# Patient Record
Sex: Female | Born: 1960 | Race: White | Hispanic: No | Marital: Married | Smoking: Current every day smoker
Health system: Southern US, Community
[De-identification: ages and names within clinical notes are randomized; demographics above are authoritative.]

## PROBLEM LIST (undated history)

## (undated) DIAGNOSIS — E079 Disorder of thyroid, unspecified: Secondary | ICD-10-CM

## (undated) DIAGNOSIS — F17200 Nicotine dependence, unspecified, uncomplicated: Secondary | ICD-10-CM

## (undated) DIAGNOSIS — D219 Benign neoplasm of connective and other soft tissue, unspecified: Secondary | ICD-10-CM

## (undated) DIAGNOSIS — D48118 Desmoid tumor of other site: Secondary | ICD-10-CM

## (undated) DIAGNOSIS — T7840XA Allergy, unspecified, initial encounter: Secondary | ICD-10-CM

## (undated) DIAGNOSIS — D481 Neoplasm of uncertain behavior of connective and other soft tissue: Secondary | ICD-10-CM

## (undated) HISTORY — PX: ROTATOR CUFF REPAIR: SHX139

## (undated) HISTORY — DX: Nicotine dependence, unspecified, uncomplicated: F17.200

## (undated) HISTORY — DX: Benign neoplasm of connective and other soft tissue, unspecified: D21.9

## (undated) HISTORY — PX: NASAL SINUS SURGERY: SHX719

## (undated) HISTORY — PX: THYROIDECTOMY: SHX17

## (undated) HISTORY — PX: APPENDECTOMY: SHX54

## (undated) HISTORY — DX: Allergy, unspecified, initial encounter: T78.40XA

## (undated) HISTORY — PX: ENDOMETRIAL ABLATION: SHX621

## (undated) HISTORY — DX: Desmoid tumor of other site: D48.118

## (undated) HISTORY — PX: CHOLECYSTECTOMY: SHX55

## (undated) HISTORY — PX: LAPAROTOMY: SHX154

## (undated) HISTORY — DX: Neoplasm of uncertain behavior of connective and other soft tissue: D48.1

## (undated) HISTORY — DX: Disorder of thyroid, unspecified: E07.9

---

## 2014-11-10 ENCOUNTER — Ambulatory Visit (INDEPENDENT_AMBULATORY_CARE_PROVIDER_SITE_OTHER): Payer: BC Managed Care – PPO | Admitting: Obstetrics & Gynecology

## 2014-11-10 ENCOUNTER — Encounter: Payer: Self-pay | Admitting: Obstetrics & Gynecology

## 2014-11-10 VITALS — BP 128/70 | HR 80 | Resp 16 | Ht 72.0 in

## 2014-11-10 DIAGNOSIS — Z124 Encounter for screening for malignant neoplasm of cervix: Secondary | ICD-10-CM | POA: Diagnosis not present

## 2014-11-10 DIAGNOSIS — E039 Hypothyroidism, unspecified: Secondary | ICD-10-CM | POA: Diagnosis not present

## 2014-11-10 DIAGNOSIS — Z01419 Encounter for gynecological examination (general) (routine) without abnormal findings: Secondary | ICD-10-CM

## 2014-11-10 DIAGNOSIS — Z1151 Encounter for screening for human papillomavirus (HPV): Secondary | ICD-10-CM | POA: Diagnosis not present

## 2014-11-10 NOTE — Progress Notes (Signed)
Subjective:    Carolyn Brock is a 53 y.o. 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 The patient has no complaints today. She had a Bartholin's cyst seen at [facility] 2 weeks ago. It was drained.  The patient is sexually active. GYN screening history: last pap: was normal. The patient wears seatbelts: yes. The patient participates in regular exercise: yes. Has the patient ever been transfused or tattooed?: no. The patient reports that there is not domestic violence in her life.   Menstrual History: OB History    Gravida Para Term Preterm AB TAB SAB Ectopic Multiple Living   1 1 1       1       Menarche age: 64  No LMP recorded. Patient has had an ablation.    The following portions of the patient's history were reviewed and updated as appropriate: allergies, current medications, past family history, past medical history, past social history, past surgical history and problem list.  Review of Systems A comprehensive review of systems was negative. She declines a flu vaccine today. She had a mammogram 9/15. Does SBEs. S/P colonoscopy. Denies dyspareunia. Married for 17 years.   Objective:    BP 128/70 mmHg  Pulse 80  Resp 16  Ht 6' (1.829 m)  General Appearance:    Alert, cooperative, no distress, appears stated age  Head:    Normocephalic, without obvious abnormality, atraumatic  Eyes:    PERRL, conjunctiva/corneas clear, EOM's intact, fundi    benign, both eyes  Ears:    Normal TM's and external ear canals, both ears  Nose:   Nares normal, septum midline, mucosa normal, no drainage    or sinus tenderness  Throat:   Lips, mucosa, and tongue normal; teeth and gums normal  Neck:   Supple, symmetrical, trachea midline, no adenopathy;    thyroid:  no enlargement/tenderness/nodules; no carotid   bruit or JVD  Back:     Symmetric, no curvature, ROM normal, no CVA tenderness  Lungs:     Clear to auscultation bilaterally, respirations unlabored  Chest  Wall:    No tenderness or deformity   Heart:    Regular rate and rhythm, S1 and S2 normal, no murmur, rub   or gallop  Breast Exam:    No tenderness, masses, or nipple abnormality  Abdomen:     Soft, non-tender, bowel sounds active all four quadrants,    no masses, no organomegaly  Genitalia:    Normal female without lesion, discharge or tenderness     Extremities:   Extremities normal, atraumatic, no cyanosis or edema  Pulses:   2+ and symmetric all extremities  Skin:   Skin color, texture, turgor normal, no rashes or lesions  Lymph nodes:   Cervical, supraclavicular, and axillary nodes normal  Neurologic:   CNII-XII intact, normal strength, sensation and reflexes    throughout  .    Assessment:    Healthy female exam.    Plan:     Breast self exam technique reviewed and patient encouraged to perform self-exam monthly. Thin prep Pap smear. with cotesting

## 2014-11-11 LAB — TSH: TSH: 0.368 u[IU]/mL (ref 0.350–4.500)

## 2014-11-15 LAB — CYTOLOGY - PAP

## 2015-10-23 ENCOUNTER — Telehealth: Payer: Self-pay | Admitting: *Deleted

## 2015-10-23 DIAGNOSIS — Z1231 Encounter for screening mammogram for malignant neoplasm of breast: Secondary | ICD-10-CM

## 2015-10-23 NOTE — Telephone Encounter (Signed)
Entered MM order for pt.

## 2015-11-01 ENCOUNTER — Ambulatory Visit (INDEPENDENT_AMBULATORY_CARE_PROVIDER_SITE_OTHER): Payer: BC Managed Care – PPO

## 2015-11-01 DIAGNOSIS — Z1231 Encounter for screening mammogram for malignant neoplasm of breast: Secondary | ICD-10-CM | POA: Diagnosis not present

## 2017-03-11 ENCOUNTER — Other Ambulatory Visit (HOSPITAL_COMMUNITY): Payer: Self-pay | Admitting: Obstetrics & Gynecology

## 2017-03-11 DIAGNOSIS — Z1231 Encounter for screening mammogram for malignant neoplasm of breast: Secondary | ICD-10-CM

## 2017-04-01 ENCOUNTER — Ambulatory Visit: Payer: BC Managed Care – PPO

## 2017-04-04 ENCOUNTER — Ambulatory Visit: Payer: BC Managed Care – PPO

## 2017-04-30 ENCOUNTER — Ambulatory Visit (INDEPENDENT_AMBULATORY_CARE_PROVIDER_SITE_OTHER): Payer: BC Managed Care – PPO

## 2017-04-30 DIAGNOSIS — Z1231 Encounter for screening mammogram for malignant neoplasm of breast: Secondary | ICD-10-CM | POA: Diagnosis not present

## 2018-04-20 ENCOUNTER — Other Ambulatory Visit (HOSPITAL_COMMUNITY): Payer: Self-pay | Admitting: Obstetrics & Gynecology

## 2018-04-20 DIAGNOSIS — Z1231 Encounter for screening mammogram for malignant neoplasm of breast: Secondary | ICD-10-CM

## 2018-05-05 DIAGNOSIS — Z8371 Family history of colonic polyps: Secondary | ICD-10-CM | POA: Insufficient documentation

## 2018-05-06 ENCOUNTER — Ambulatory Visit (INDEPENDENT_AMBULATORY_CARE_PROVIDER_SITE_OTHER): Payer: BC Managed Care – PPO

## 2018-05-06 DIAGNOSIS — Z1231 Encounter for screening mammogram for malignant neoplasm of breast: Secondary | ICD-10-CM | POA: Diagnosis not present

## 2018-09-28 ENCOUNTER — Ambulatory Visit (INDEPENDENT_AMBULATORY_CARE_PROVIDER_SITE_OTHER): Payer: BC Managed Care – PPO | Admitting: Physician Assistant

## 2018-09-28 ENCOUNTER — Ambulatory Visit (INDEPENDENT_AMBULATORY_CARE_PROVIDER_SITE_OTHER): Payer: BC Managed Care – PPO

## 2018-09-28 ENCOUNTER — Encounter: Payer: Self-pay | Admitting: Physician Assistant

## 2018-09-28 VITALS — BP 114/74 | HR 78 | Ht 71.5 in | Wt 181.0 lb

## 2018-09-28 DIAGNOSIS — E890 Postprocedural hypothyroidism: Secondary | ICD-10-CM

## 2018-09-28 DIAGNOSIS — J309 Allergic rhinitis, unspecified: Secondary | ICD-10-CM

## 2018-09-28 DIAGNOSIS — Z130 Encounter for screening for diseases of the blood and blood-forming organs and certain disorders involving the immune mechanism: Secondary | ICD-10-CM

## 2018-09-28 DIAGNOSIS — Z7689 Persons encountering health services in other specified circumstances: Secondary | ICD-10-CM

## 2018-09-28 DIAGNOSIS — J329 Chronic sinusitis, unspecified: Secondary | ICD-10-CM

## 2018-09-28 DIAGNOSIS — Z13220 Encounter for screening for lipoid disorders: Secondary | ICD-10-CM

## 2018-09-28 DIAGNOSIS — G43009 Migraine without aura, not intractable, without status migrainosus: Secondary | ICD-10-CM

## 2018-09-28 DIAGNOSIS — F17200 Nicotine dependence, unspecified, uncomplicated: Secondary | ICD-10-CM | POA: Insufficient documentation

## 2018-09-28 DIAGNOSIS — J3489 Other specified disorders of nose and nasal sinuses: Secondary | ICD-10-CM

## 2018-09-28 DIAGNOSIS — Z131 Encounter for screening for diabetes mellitus: Secondary | ICD-10-CM

## 2018-09-28 MED ORDER — FLUTICASONE PROPIONATE 50 MCG/ACT NA SUSP: 1.0000 | Freq: Every day | NASAL | 3 refills | Status: AC

## 2018-09-28 MED ORDER — ELETRIPTAN HYDROBROMIDE 40 MG PO TABS: ORAL_TABLET | ORAL | 3 refills | Status: DC

## 2018-09-28 MED ORDER — LEVOCETIRIZINE DIHYDROCHLORIDE 5 MG PO TABS: 5.0000 mg | ORAL_TABLET | Freq: Every evening | ORAL | 3 refills | Status: AC

## 2018-09-28 NOTE — Progress Notes (Signed)
HPI:                                                                Carolyn Brock is a 57 y.o. female who presents to [facility] today to establish care  Current concerns: sinuses/headaches  Migraines: states her sinuses are the biggest trigger for her headaches. Typical migraine she will have severe pain behind her left eye. Associated with nausea, photophobia. These occur once every several months. They are aborted with Relpax.   Recurrent sinusitis: reports she is allergic to mold. She had immunotherapy in 2005. She has a history of sinus surgery in the mid 1990's, states this was not helpful.   Hypothyroidism: takes synthroid daily without issues. Denies chest pain, heart palpitations, tremor, GI symptoms, or skin changes.   No flowsheet data found.  No flowsheet data found.    Past Medical History:  Diagnosis Date  . Desmoid tumor of abdomen   . Fibroid   . Thyroid disease   . Tobacco use disorder    Past Surgical History:  Procedure Laterality Date  . APPENDECTOMY    . CESAREAN SECTION    . CHOLECYSTECTOMY    . ENDOMETRIAL ABLATION    . LAPAROTOMY    . NASAL SINUS SURGERY    . ROTATOR CUFF REPAIR    . THYROIDECTOMY     Social History   Tobacco Use  . Smoking status: Current Every Day Smoker    Packs/day: 0.50    Years: 35.00    Pack years: 17.50    Types: Cigarettes  . Smokeless tobacco: Never Used  Substance Use Topics  . Alcohol use: Not Currently    Alcohol/week: 0.0 standard drinks    Comment: rarely   family history includes Breast cancer in her mother and paternal grandmother; Cancer in her mother and paternal grandmother; Depression in her father; Diabetes in her mother and paternal grandmother.    ROS: Review of Systems  HENT: Positive for sinus pain.   Neurological: Positive for headaches.  Endo/Heme/Allergies: Positive for environmental allergies.  All other systems reviewed and are  negative.    Medications: Current Outpatient Medications  Medication Sig Dispense Refill  . eletriptan (RELPAX) 40 MG tablet TAKE ONE TABLET AT ONSET OF HEADACHE, MAY REPEAT IN TWO HOURS. 10 tablet 3  . promethazine (PHENERGAN) 25 MG tablet Take 25 mg by mouth every 6 (six) hours as needed.    . fluticasone (FLONASE) 50 MCG/ACT nasal spray Place 1 spray into both nostrils daily. 16 g 3  . levocetirizine (XYZAL ALLERGY 24HR) 5 MG tablet Take 1 tablet (5 mg total) by mouth every evening. 90 tablet 3  . levothyroxine (SYNTHROID, LEVOTHROID) 125 MCG tablet Take 1 tablet (125 mcg total) by mouth daily before breakfast. 90 tablet 1  . VITAMIN D, CHOLECALCIFEROL, PO Take by mouth.     No current facility-administered medications for this visit.    Allergies  Allergen Reactions  . Codeine Nausea And Vomiting  . Imitrex [Sumatriptan] Other (See Comments)    palpitations  . Penicillins Rash       Objective:  BP 114/74   Pulse 78   Ht 5' 11.5" (1.816 m)   Wt 181 lb (82.1 kg)   BMI  24.89 kg/m  Gen:  alert, not ill-appearing, no distress, appropriate for age 32: head normocephalic without obvious abnormality, conjunctiva and cornea clear, TM's pearly gray and semitransparent, nasal mucosa pink, no frontal or maxillary sinus tenderness, oropharynx clear, neck supple, no cervical adenopathy, trachea midline Pulm: Normal work of breathing, normal phonation, clear to auscultation bilaterally, no wheezes, rales or rhonchi CV: Normal rate, regular rhythm, s1 and s2 distinct, no murmurs, clicks or rubs  Neuro: alert and oriented x 3, no tremor MSK: extremities atraumatic, normal gait and station Skin: intact, no rashes on exposed skin, no jaundice, no cyanosis Psych: well-groomed, cooperative, good eye contact, euthymic mood, affect mood-congruent, speech is articulate, and thought processes clear and goal-directed    No results found for this or any previous visit (from the past 72  hour(s)). No results found.    Assessment and Plan: 57 y.o. female with   .[name] was seen today for establish care.  Diagnoses and all orders for this visit:  Encounter to establish care  Postsurgical hypothyroidism -     TSH  Migraine without aura and without status migrainosus, not intractable -     eletriptan (RELPAX) 40 MG tablet; TAKE ONE TABLET AT ONSET OF HEADACHE, MAY REPEAT IN TWO HOURS.  Allergic rhinitis, unspecified seasonality, unspecified trigger -     fluticasone (FLONASE) 50 MCG/ACT nasal spray; Place 1 spray into both nostrils daily. -     levocetirizine (XYZAL ALLERGY 24HR) 5 MG tablet; Take 1 tablet (5 mg total) by mouth every evening.  Tobacco use disorder  Recurrent sinus infections -     Cancel: CT Maxillofacial WO CM -     CT MAXILLOFACIAL LTD WO CM  Screening for lipid disorders -     Lipid Panel w/reflex Direct LDL  Screening for diabetes mellitus -     Comprehensive metabolic panel  Screening for blood disease -     CBC  Sinus pressure -     Cancel: CT Maxillofacial WO CM -     CT MAXILLOFACIAL LTD WO CM   - Personally reviewed PMH, PSH, PFH, medications, allergies, HM - Age-appropriate cancer screening: Pap smear UTD, mammogram UTD, not yet a candidate for lung cancer screening - Influenza declined - Tdap declined  Sinus pressure/history of sinusitis - oral antihistamine and intranasal steroid daily - CT sinus pending - we briefly discussed smoking cessation  - we also discussed option to repeat immunotherapy. Declines referral to Allergy/Immunology today  Patient education and anticipatory guidance given Patient agrees with treatment plan Follow-up in 1 month or sooner as needed if symptoms worsen or fail to improve  Darlyne Russian PA-C

## 2018-09-28 NOTE — Patient Instructions (Addendum)
Continue Montelukast/Singulair at bedtime Start Xyzal (antihistamine). Can take this with your Singulair Start generic Flonase (intranasal steroid) daily Avoid Afrin/Oxymetazolone, which causes rebound nasal congestion when used for more than 3 days   Allergic Rhinitis, Adult Allergic rhinitis is an allergic reaction that affects the mucous membrane inside the nose. It causes sneezing, a runny or stuffy nose, and the feeling of mucus going down the back of the throat (postnasal drip). Allergic rhinitis can be mild to severe. There are two types of allergic rhinitis:  Seasonal. This type is also called hay fever. It happens only during certain seasons.  Perennial. This type can happen at any time of the year.  What are the causes? This condition happens when the body's defense system (immune system) responds to certain harmless substances called allergens as though they were germs.  Seasonal allergic rhinitis is triggered by pollen, which can come from grasses, trees, and weeds. Perennial allergic rhinitis may be caused by:  House dust mites.  Pet dander.  Mold spores.  What are the signs or symptoms? Symptoms of this condition include:  Sneezing.  Runny or stuffy nose (nasal congestion).  Postnasal drip.  Itchy nose.  Tearing of the eyes.  Trouble sleeping.  Daytime sleepiness.  How is this diagnosed? This condition may be diagnosed based on:  Your medical history.  A physical exam.  Tests to check for related conditions, such as: ? Asthma. ? Pink eye. ? Ear infection. ? Upper respiratory infection.  Tests to find out which allergens trigger your symptoms. These may include skin or blood tests.  How is this treated? There is no cure for this condition, but treatment can help control symptoms. Treatment may include:  Taking medicines that block allergy symptoms, such as antihistamines. Medicine may be given as a shot, nasal spray, or pill.  Avoiding the  allergen.  Desensitization. This treatment involves getting ongoing shots until your body becomes less sensitive to the allergen. This treatment may be done if other treatments do not help.  If taking medicine and avoiding the allergen does not work, new, stronger medicines may be prescribed.  Follow these instructions at home:  Find out what you are allergic to. Common allergens include smoke, dust, and pollen.  Avoid the things you are allergic to. These are some things you can do to help avoid allergens: ? Replace carpet with wood, tile, or vinyl flooring. Carpet can trap dander and dust. ? Do not smoke. Do not allow smoking in your home. ? Change your heating and air conditioning filter at least once a month. ? During allergy season:  Keep windows closed as much as possible.  Plan outdoor activities when pollen counts are lowest. This is usually during the evening hours.  When coming indoors, change clothing and shower before sitting on furniture or bedding.  Take over-the-counter and prescription medicines only as told by your health care provider.  Keep all follow-up visits as told by your health care provider. This is important. Contact a health care provider if:  You have a fever.  You develop a persistent cough.  You make whistling sounds when you breathe (you wheeze).  Your symptoms interfere with your normal daily activities. Get help right away if:  You have shortness of breath. Summary  This condition can be managed by taking medicines as directed and avoiding allergens.  Contact your health care provider if you develop a persistent cough or fever.  During allergy season, keep windows closed as much as possible.  This information is not intended to replace advice given to you by your health care provider. Make sure you discuss any questions you have with your health care provider. Document Released: 09/10/2001 Document Revised: 01/23/2017 Document Reviewed:  01/23/2017 Elsevier Interactive Patient Education  [employer].

## 2018-09-29 LAB — COMPREHENSIVE METABOLIC PANEL
AG Ratio: 2.1 (calc) (ref 1.0–2.5)
ALT: 16 U/L (ref 6–29)
AST: 25 U/L (ref 10–35)
Albumin: 4.5 g/dL (ref 3.6–5.1)
Alkaline phosphatase (APISO): 89 U/L (ref 33–130)
BUN: 17 mg/dL (ref 7–25)
CO2: 25 mmol/L (ref 20–32)
CREATININE: 0.94 mg/dL (ref 0.50–1.05)
Calcium: 9.4 mg/dL (ref 8.6–10.4)
Chloride: 103 mmol/L (ref 98–110)
GLUCOSE: 100 mg/dL — AB (ref 65–99)
Globulin: 2.1 g/dL (calc) (ref 1.9–3.7)
Potassium: 4.8 mmol/L (ref 3.5–5.3)
Sodium: 138 mmol/L (ref 135–146)
TOTAL PROTEIN: 6.6 g/dL (ref 6.1–8.1)
Total Bilirubin: 0.9 mg/dL (ref 0.2–1.2)

## 2018-09-29 LAB — LIPID PANEL W/REFLEX DIRECT LDL
CHOL/HDL RATIO: 2.6 (calc) (ref <5.0)
Cholesterol: 176 mg/dL (ref <200)
HDL: 67 mg/dL (ref >50)
LDL Cholesterol (Calc): 93 mg/dL (calc)
NON-HDL CHOLESTEROL (CALC): 109 mg/dL (ref <130)
TRIGLYCERIDES: 69 mg/dL (ref <150)

## 2018-09-29 LAB — CBC
HCT: 44.1 % (ref 35.0–45.0)
Hemoglobin: 14.8 g/dL (ref 11.7–15.5)
MCH: 31 pg (ref 27.0–33.0)
MCHC: 33.6 g/dL (ref 32.0–36.0)
MCV: 92.3 fL (ref 80.0–100.0)
MPV: 10.4 fL (ref 7.5–12.5)
PLATELETS: 275 10*3/uL (ref 140–400)
RBC: 4.78 10*6/uL (ref 3.80–5.10)
RDW: 12.5 % (ref 11.0–15.0)
WBC: 5.7 10*3/uL (ref 3.8–10.8)

## 2018-09-29 LAB — TSH: TSH: 0.08 mIU/L — ABNORMAL LOW

## 2018-09-30 ENCOUNTER — Other Ambulatory Visit: Payer: Self-pay | Admitting: Physician Assistant

## 2018-09-30 DIAGNOSIS — E890 Postprocedural hypothyroidism: Secondary | ICD-10-CM

## 2018-09-30 MED ORDER — LEVOTHYROXINE SODIUM 125 MCG PO TABS: 125.0000 ug | ORAL_TABLET | Freq: Every day | ORAL | 1 refills | Status: DC

## 2018-10-08 ENCOUNTER — Encounter: Payer: Self-pay | Admitting: Physician Assistant

## 2018-10-08 DIAGNOSIS — J329 Chronic sinusitis, unspecified: Secondary | ICD-10-CM | POA: Insufficient documentation

## 2018-10-08 DIAGNOSIS — J3489 Other specified disorders of nose and nasal sinuses: Secondary | ICD-10-CM | POA: Insufficient documentation

## 2018-10-26 ENCOUNTER — Ambulatory Visit: Payer: BC Managed Care – PPO | Admitting: Physician Assistant

## 2019-03-11 ENCOUNTER — Other Ambulatory Visit: Payer: Self-pay | Admitting: Physician Assistant

## 2019-03-11 DIAGNOSIS — E890 Postprocedural hypothyroidism: Secondary | ICD-10-CM

## 2019-06-07 ENCOUNTER — Other Ambulatory Visit: Payer: Self-pay | Admitting: Physician Assistant

## 2019-06-07 DIAGNOSIS — E890 Postprocedural hypothyroidism: Secondary | ICD-10-CM

## 2019-09-10 ENCOUNTER — Other Ambulatory Visit: Payer: Self-pay

## 2019-09-10 ENCOUNTER — Ambulatory Visit (INDEPENDENT_AMBULATORY_CARE_PROVIDER_SITE_OTHER): Payer: BC Managed Care – PPO

## 2019-09-10 ENCOUNTER — Ambulatory Visit (INDEPENDENT_AMBULATORY_CARE_PROVIDER_SITE_OTHER): Payer: BC Managed Care – PPO | Admitting: Physician Assistant

## 2019-09-10 ENCOUNTER — Other Ambulatory Visit: Payer: Self-pay | Admitting: Obstetrics & Gynecology

## 2019-09-10 ENCOUNTER — Encounter: Payer: Self-pay | Admitting: Physician Assistant

## 2019-09-10 VITALS — BP 122/77 | HR 79 | Temp 98.6°F | Wt 183.0 lb

## 2019-09-10 DIAGNOSIS — M25512 Pain in left shoulder: Secondary | ICD-10-CM | POA: Insufficient documentation

## 2019-09-10 DIAGNOSIS — Z5181 Encounter for therapeutic drug level monitoring: Secondary | ICD-10-CM

## 2019-09-10 DIAGNOSIS — E890 Postprocedural hypothyroidism: Secondary | ICD-10-CM | POA: Diagnosis not present

## 2019-09-10 DIAGNOSIS — Z1231 Encounter for screening mammogram for malignant neoplasm of breast: Secondary | ICD-10-CM

## 2019-09-10 MED ORDER — LEVOTHYROXINE SODIUM 125 MCG PO TABS: 125.0000 ug | ORAL_TABLET | Freq: Every day | ORAL | 0 refills | Status: DC

## 2019-09-10 NOTE — Progress Notes (Signed)
Subjective:    I'm seeing this patient as a consultation for: Nelson Chimes, PA-C  CC: Left shoulder pain  HPI: For several weeks this pleasant 58 year old female has had severe pain that she localizes over the deltoid of her left shoulder, worse with overhead activities, no trauma, she is post rotator cuff repair arthroscopically and acromioplasty.  Pain is severe, persistent, localized without radiation.  I reviewed the past medical history, family history, social history, surgical history, and allergies today and no changes were needed.  Please see the problem list section below in epic for further details.  Past Medical History: Past Medical History:  Diagnosis Date  . Allergy   . Desmoid tumor of abdomen   . Fibroid   . Thyroid disease   . Tobacco use disorder    Past Surgical History: Past Surgical History:  Procedure Laterality Date  . APPENDECTOMY    . CESAREAN SECTION    . CHOLECYSTECTOMY    . ENDOMETRIAL ABLATION    . LAPAROTOMY    . NASAL SINUS SURGERY    . ROTATOR CUFF REPAIR    . THYROIDECTOMY     Social History: Social History   Socioeconomic History  . Marital status: Married    Spouse name: Not on file  . Number of children: Not on file  . Years of education: Not on file  . Highest education level: Not on file  Occupational History  . Occupation: principal  Social Needs  . Financial resource strain: Not on file  . Food insecurity    Worry: Not on file    Inability: Not on file  . Transportation needs    Medical: Not on file    Non-medical: Not on file  Tobacco Use  . Smoking status: Current Every Day Smoker    Packs/day: 0.50    Years: 35.00    Pack years: 17.50    Types: Cigarettes  . Smokeless tobacco: Never Used  Substance and Sexual Activity  . Alcohol use: Not Currently    Alcohol/week: 0.0 standard drinks    Comment: rarely  . Drug use: Never  . Sexual activity: Yes    Partners: Male    Birth control/protection: None   Lifestyle  . Physical activity    Days per week: Not on file    Minutes per session: Not on file  . Stress: Not on file  Relationships  . Social Herbalist on phone: Not on file    Gets together: Not on file    Attends religious service: Not on file    Active member of club or organization: Not on file    Attends meetings of clubs or organizations: Not on file    Relationship status: Not on file  Other Topics Concern  . Not on file  Social History Narrative  . Not on file   Family History: Family History  Problem Relation Age of Onset  . Diabetes Mother   . Cancer Mother        breast  . Breast cancer Mother   . Depression Father   . Diabetes Paternal Grandmother   . Cancer Paternal Grandmother        breast  . Breast cancer Paternal Grandmother    Allergies: Allergies  Allergen Reactions  . Codeine Nausea And Vomiting  . Imitrex [Sumatriptan] Other (See Comments)    palpitations  . Penicillins Rash   Medications: See med rec.  Review of Systems: No headache, visual changes, nausea,  vomiting, diarrhea, constipation, dizziness, abdominal pain, skin rash, fevers, chills, night sweats, weight loss, swollen lymph nodes, body aches, joint swelling, muscle aches, chest pain, shortness of breath, mood changes, visual or auditory hallucinations.   Objective:   General: Well Developed, well nourished, and in no acute distress.  Neuro:  Extra-ocular muscles intact, able to move all 4 extremities, sensation grossly intact.  Deep tendon reflexes tested were normal. Psych: Alert and oriented, mood congruent with affect. ENT:  Ears and nose appear unremarkable.  Hearing grossly normal. Neck: Unremarkable overall appearance, trachea midline.  No visible thyroid enlargement. Eyes: Conjunctivae and lids appear unremarkable.  Pupils equal and round. Skin: Warm and dry, no rashes noted.  Cardiovascular: Pulses palpable, no extremity edema. Left shoulder: Inspection  reveals no abnormalities, atrophy or asymmetry. Palpation is normal with no tenderness over AC joint or bicipital groove. ROM is full in all planes. Rotator cuff strength normal throughout. Positive Neer and Hawkin's tests, empty can. Speeds and Yergason's tests normal. No labral pathology noted with negative Obrien's, negative crank, negative clunk, and good stability. Normal scapular function observed. No painful arc and no drop arm sign. No apprehension sign  Procedure: Real-time Ultrasound Guided injection of the left subacromial bursa Device: GE Logiq E  Verbal informed consent obtained.  Time-out conducted.  Noted no overlying erythema, induration, or other signs of local infection.  Skin prepped in a sterile fashion.  Local anesthesia: Topical Ethyl chloride.  With sterile technique and under real time ultrasound guidance:  1 cc Kenalog 40, 2 cc lidocaine, 2 cc bupivacaine injected easily Completed without difficulty  Pain immediately resolved suggesting accurate placement of the medication.  Advised to call if fevers/chills, erythema, induration, drainage, or persistent bleeding.  Images permanently stored and available for review in the ultrasound unit.  Impression: Technically successful ultrasound guided injection.  Impression and Recommendations:   This case required medical decision making of moderate complexity.  Acute pain of left shoulder Symptoms and signs of impingement related. She is post rotator cuff repair. Subacromial injection today, x-rays, formal physical therapy. Return to see me in 6 weeks.   ___________________________________________ Gwen Her. Dianah Field, M.D., ABFM., CAQSM. Primary Care and Sports Medicine [facility]  Adjunct Professor of [employer]

## 2019-09-10 NOTE — Progress Notes (Signed)
HPI:                                                                Carolyn Brock is a 58 y.o. female who presents to [facility]: [place] today for left shoulder pain  Onset 2 months ago Reports pain in the anterior shoulder joint Pain is persistent and worse with any type of movement, but worse with abduction History of rotator cuff repair on the same shoulder (fall 2015) She denies known injury or trauma or repetitive stress She is right-handed Taking 4-6 Advil per day, Tylenol and using Biofreeze without relief   Postsurgical Hypothyroidism: unclear history. Reports she had some radioactive treatments and nodules. Takes synthroid daily without issues. Dose was lowered to 125 mcg approx 11 months ago. Denies chest pain, heart palpitations, tremor, GI symptoms, or skin changes.   Past Medical History:  Diagnosis Date  . Allergy   . Desmoid tumor of abdomen   . Fibroid   . Thyroid disease   . Tobacco use disorder    Past Surgical History:  Procedure Laterality Date  . APPENDECTOMY    . CESAREAN SECTION    . CHOLECYSTECTOMY    . ENDOMETRIAL ABLATION    . LAPAROTOMY    . NASAL SINUS SURGERY    . ROTATOR CUFF REPAIR    . THYROIDECTOMY     Social History   Tobacco Use  . Smoking status: Current Every Day Smoker    Packs/day: 0.50    Years: 35.00    Pack years: 17.50    Types: Cigarettes  . Smokeless tobacco: Never Used  Substance Use Topics  . Alcohol use: Not Currently    Alcohol/week: 0.0 standard drinks    Comment: rarely   family history includes Breast cancer in her mother and paternal grandmother; Cancer in her mother and paternal grandmother; Depression in her father; Diabetes in her mother and paternal grandmother.    ROS: negative except as noted in the HPI  Medications: Current Outpatient Medications  Medication Sig Dispense Refill  . eletriptan (RELPAX) 40 MG tablet TAKE ONE TABLET AT ONSET OF HEADACHE, MAY  REPEAT IN TWO HOURS. 10 tablet 3  . fluticasone (FLONASE) 50 MCG/ACT nasal spray Place 1 spray into both nostrils daily. 16 g 3  . levocetirizine (XYZAL ALLERGY 24HR) 5 MG tablet Take 1 tablet (5 mg total) by mouth every evening. 90 tablet 3  . levothyroxine (SYNTHROID) 125 MCG tablet TAKE 1 TABLET BY MOUTH EVERY DAY BEFORE BREAKFAST 90 tablet 0  . VITAMIN D, CHOLECALCIFEROL, PO Take by mouth.    . promethazine (PHENERGAN) 25 MG tablet Take 25 mg by mouth every 6 (six) hours as needed.     No current facility-administered medications for this visit.    Allergies  Allergen Reactions  . Codeine Nausea And Vomiting  . Imitrex [Sumatriptan] Other (See Comments)    palpitations  . Penicillins Rash       Objective:  BP 122/77   Pulse 79   Temp 98.6 F (37 C) (Oral)   Wt 183 lb (83 kg)   BMI 25.17 kg/m   Wt Readings from Last 3 Encounters:  09/10/19 183 lb (83 kg)  09/28/18 181 lb (82.1 kg)   Temp  Readings from Last 3 Encounters:  09/10/19 98.6 F (37 C) (Oral)   BP Readings from Last 3 Encounters:  09/10/19 122/77  09/28/18 114/74  11/10/14 128/70   Pulse Readings from Last 3 Encounters:  09/10/19 79  09/28/18 78  11/10/14 80    Gen:  alert, not ill-appearing, no distress, appropriate for age 83: head normocephalic without obvious abnormality, conjunctiva and cornea clear, trachea midline Pulm: Normal work of breathing, normal phonation Neuro: alert and oriented x 3, no tremor MSK: extremities atraumatic, normal gait and station Left shouder: pain overlying left a/c, no crepitus, positive Hawkin's with guarding, strength intact Skin: intact, no rashes on exposed skin, no jaundice, no cyanosis Psych: well-groomed, cooperative, good eye contact, euthymic mood, affect mood-congruent, speech is articulate, and thought processes clear and goal-directed    No results found for this or any previous visit (from the past 72 hour(s)). No results found.    Assessment  and Plan: 58 y.o. female with   .Carolyn Brock was seen today for shoulder pain.  Diagnoses and all orders for this visit:  Postsurgical hypothyroidism -     TSH + free T4 -     COMPLETE METABOLIC PANEL WITH GFR -     CBC -     levothyroxine (SYNTHROID) 125 MCG tablet; Take 1 tablet (125 mcg total) by mouth daily before breakfast.  Encounter for medication monitoring -     TSH + free T4 -     COMPLETE METABOLIC PANEL WITH GFR -     CBC  Acute pain of left shoulder -     DG Shoulder Left; Future -     Ambulatory referral to Physical Therapy   Consulted sports medicine for L shoulder pain (see note) Referral for formal PT for shoulder impingement    Patient education and anticipatory guidance given Patient agrees with treatment plan Follow-up in 6 months for med mgmt or sooner as needed if symptoms worsen or fail to improve  Darlyne Russian PA-C

## 2019-09-10 NOTE — Assessment & Plan Note (Signed)
Symptoms and signs of impingement related. She is post rotator cuff repair. Subacromial injection today, x-rays, formal physical therapy. Return to see me in 6 weeks.

## 2019-09-11 ENCOUNTER — Encounter: Payer: Self-pay | Admitting: Sports Medicine

## 2019-09-11 LAB — COMPLETE METABOLIC PANEL WITH GFR
AG Ratio: 2.2 (calc) (ref 1.0–2.5)
ALT: 20 U/L (ref 6–29)
AST: 26 U/L (ref 10–35)
Albumin: 4.6 g/dL (ref 3.6–5.1)
Alkaline phosphatase (APISO): 83 U/L (ref 37–153)
BUN: 19 mg/dL (ref 7–25)
CO2: 26 mmol/L (ref 20–32)
Calcium: 9.5 mg/dL (ref 8.6–10.4)
Chloride: 104 mmol/L (ref 98–110)
Creat: 0.97 mg/dL (ref 0.50–1.05)
GFR, Est African American: 76 mL/min/{1.73_m2} (ref >=60)
GFR, Est Non African American: 65 mL/min/{1.73_m2} (ref >=60)
Globulin: 2.1 g/dL (calc) (ref 1.9–3.7)
Glucose, Bld: 102 mg/dL — ABNORMAL HIGH (ref 65–99)
Potassium: 4.9 mmol/L (ref 3.5–5.3)
Sodium: 140 mmol/L (ref 135–146)
Total Bilirubin: 0.6 mg/dL (ref 0.2–1.2)
Total Protein: 6.7 g/dL (ref 6.1–8.1)

## 2019-09-11 LAB — CBC
HCT: 42.8 % (ref 35.0–45.0)
Hemoglobin: 14.3 g/dL (ref 11.7–15.5)
MCH: 31.4 pg (ref 27.0–33.0)
MCHC: 33.4 g/dL (ref 32.0–36.0)
MCV: 94.1 fL (ref 80.0–100.0)
MPV: 10.5 fL (ref 7.5–12.5)
Platelets: 264 10*3/uL (ref 140–400)
RBC: 4.55 10*6/uL (ref 3.80–5.10)
RDW: 12.7 % (ref 11.0–15.0)
WBC: 9.1 10*3/uL (ref 3.8–10.8)

## 2019-09-11 LAB — TSH+FREE T4: TSH W/REFLEX TO FT4: 1.48 mIU/L (ref 0.40–4.50)

## 2019-09-12 ENCOUNTER — Encounter: Payer: Self-pay | Admitting: Sports Medicine

## 2019-09-12 DIAGNOSIS — M25512 Pain in left shoulder: Secondary | ICD-10-CM

## 2019-09-16 ENCOUNTER — Ambulatory Visit (INDEPENDENT_AMBULATORY_CARE_PROVIDER_SITE_OTHER): Payer: BC Managed Care – PPO | Admitting: Physical Therapy

## 2019-09-16 ENCOUNTER — Other Ambulatory Visit: Payer: Self-pay

## 2019-09-16 ENCOUNTER — Encounter: Payer: Self-pay | Admitting: Physical Therapy

## 2019-09-16 DIAGNOSIS — M25512 Pain in left shoulder: Secondary | ICD-10-CM

## 2019-09-16 DIAGNOSIS — R293 Abnormal posture: Secondary | ICD-10-CM

## 2019-09-16 DIAGNOSIS — M6281 Muscle weakness (generalized): Secondary | ICD-10-CM | POA: Diagnosis not present

## 2019-09-16 NOTE — Patient Instructions (Signed)
Access Code: [email or web address]  URL: [email or web address]  Date: 09/16/2019  Prepared by: Faustino Congress   Exercises  Standing Backward Shoulder Rolls - 10 reps - 1 sets - 2x daily - 7x weekly  Seated Scapular Retraction - 10 reps - 1 sets - 5 sec hold - 2x daily - 7x weekly  Shoulder External Rotation and Scapular Retraction - 10 reps - 1 sets - 1-2 sec hold - 2x daily - 7x weekly  Shoulder External Rotation and Scapular Retraction with Resistance - 10 reps - 1 sets - 5 sec hold - 2x daily - 7x weekly  Scapular Retraction with Resistance Advanced - 10 reps - 1 sets - 2x daily - 7x weekly  Squatting Shoulder Row with Anchored Resistance - 10 reps - 1 sets - 2x daily - 7x weekly  Patient Education  Trigger Point Dry Needling  TENS Unit

## 2019-09-16 NOTE — Therapy (Addendum)
[place], [place], [address] Phone: [phone]   Fax:  [phone]  Physical Therapy Evaluation  Patient Details  Name: Carolyn Brock MRN: [id number] Date of Birth: Sep 29, 1961 Referring Provider (PT): Silverio Decamp, MD   Encounter Date: 09/16/2019  PT End of Session - 09/16/19 1017    Visit Number  1    Number of Visits  6    Date for PT Re-Evaluation  10/28/19    PT Start Time  0932    PT Stop Time  1010    PT Time Calculation (min)  38 min    Activity Tolerance  Patient tolerated treatment well    Behavior During Therapy  [facility] for tasks assessed/performed       Past Medical History:  Diagnosis Date  . Allergy   . Desmoid tumor of abdomen   . Fibroid   . Thyroid disease   . Tobacco use disorder     Past Surgical History:  Procedure Laterality Date  . APPENDECTOMY    . CESAREAN SECTION    . CHOLECYSTECTOMY    . ENDOMETRIAL ABLATION    . LAPAROTOMY    . NASAL SINUS SURGERY    . ROTATOR CUFF REPAIR    . THYROIDECTOMY      There were no vitals filed for this visit.   Subjective Assessment - 09/16/19 0934    Subjective  Pt is a 58 y/o female who presents to OPPT for Lt shoulder pain without known injury x 2 months.  Pt reports popping episodes with short-lived sharp pains.  Pt had injection 09/10/19 with improvement in pain, then she reported increased pain with overuse over the weekend.  Pt states pain has now improved since that incident.    Pertinent History  Lt RTC repair 2014    Patient Stated Goals  improve strength, decrease risk of reinjury    Currently in Pain?  No/denies   up to 9/10        [facility] PT Assessment - 09/16/19 0939      Assessment   Medical Diagnosis  M25.512 (ICD-10-CM) - Acute pain of left shoulder    Referring Provider (PT)  Silverio Decamp, MD    Onset Date/Surgical Date  --   2 months   Hand Dominance  Right    Next MD Visit   10/22/19    Prior Therapy  following RTC repair in 2014      Precautions   Precautions  None      Restrictions   Weight Bearing Restrictions  No      Balance Screen   Has the patient fallen in the past 6 months  No    Has the patient had a decrease in activity level because of a fear of falling?   No    Is the patient reluctant to leave their home because of a fear of falling?   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; realtor    Leisure  hiking; concerts - prior to [place]: going to gym      Cognition  Overall Cognitive Status  Within Functional Limits for tasks assessed      Observation/Other Assessments   Focus on Therapeutic Outcomes (FOTO)   58 (42% limited; predicted 30% limited)      Posture/Postural Control   Posture/Postural Control  Postural limitations    Postural Limitations  Rounded Shoulders;Forward head    Posture Comments  poor postural awareness throughout session      ROM / Strength   AROM / PROM / Strength  AROM;Strength      AROM   Overall AROM Comments  Lt shoulder WNL with mild apprehension with initiating movements      Strength   Strength Assessment Site  Shoulder    Right/Left Shoulder  Right;Left    Right Shoulder Flexion  5/5    Right Shoulder ABduction  5/5    Right Shoulder Internal Rotation  5/5    Right Shoulder External Rotation  5/5    Left Shoulder Flexion  3+/5   with pain   Left Shoulder ABduction  3+/5   with pain   Left Shoulder Internal Rotation  5/5    Left Shoulder External Rotation  4/5      Palpation   Palpation comment  pain and tenderness along proximal biceps tendon and supraspinatus tendon insertion; trigger  points noted in upper trap, levator and deltoid on Lt                Objective measurements completed on examination: See above findings.      [facility] Adult PT Treatment/Exercise - 09/16/19 0939      Exercises   Exercises  Shoulder      Shoulder Exercises: Seated   Retraction  Both;5 reps    External Rotation  Both;5 reps    Other Seated Exercises  shoulder rolls backwards x 5 reps      Shoulder Exercises: Standing   External Rotation  Both;5 reps;Theraband    Theraband Level (Shoulder External Rotation)  Level 2 (Red)    Extension  Both;5 reps;Theraband    Theraband Level (Shoulder Extension)  Level 2 (Red)    Row  Both;5 reps;Theraband    Theraband Level (Shoulder Row)  Level 2 (Red)    Other Standing Exercises  cues needed with all exercises for technique and form             PT Education - 09/16/19 1017    Education Details  HEP, DN, TENS    Person(s) Educated  Patient    Methods  Explanation;Demonstration    Comprehension  Verbalized understanding;Returned demonstration;Need further instruction          PT Long Term Goals - 09/16/19 1046      PT LONG TERM GOAL #1   Title  independent with HEP    Status  New    Target Date  10/28/19      PT LONG TERM GOAL #2   Title  FOTO score improved to </= 30% limited for improved function    Status  New    Target Date  10/28/19      PT LONG TERM GOAL #3   Title  verbalized understanding of posture in order to decrease risk of reinjury    Status  New    Target Date  10/28/19      PT LONG TERM GOAL #4   Title  perform Lt shoulder ROM without pain or apprehension for improved function    Status  New    Target Date  10/28/19  PT LONG TERM GOAL #5   Title  demonstrate at least 4/5 Lt shoulder strength for improved function    Status  New    Target Date  10/28/19             Plan - 09/16/19 1018    Clinical Impression Statement  Pt is a 58 y/o female who presents to OPPT for 2 month hx of  Lt shoulder pain without known injury.  Pt does report increased computer work and less exercise due to gym closure and feel this is likely contributing to new onset of symptoms.  Pt demonstrates poor postural awareness, decreased strength and active trigger points affecting functional mobility.  Will benefit from PT to address deficits listed.    Personal Factors and Comorbidities  Comorbidity 1    Comorbidities  hx LT RTC repair    Examination-Activity Limitations  Reach Overhead;Dressing;Carry    Examination-Participation Restrictions  Volunteer   worrk   Stability/Clinical Decision Making  Stable/Uncomplicated    Clinical Decision Making  Low    Rehab Potential  Good    PT Frequency  1x / week    PT Duration  6 weeks    PT Treatment/Interventions  ADLs/Self Care Home Management;Cryotherapy;Electrical Stimulation;Ultrasound;Moist Heat;Iontophoresis 47m/ml Dexamethasone;Therapeutic activities;Therapeutic exercise;Patient/family education;Manual techniques;Passive range of motion;Vasopneumatic Device;Taping;Dry needling    PT Next Visit Plan  review HEP and progress (add doorway stretch), manual/modalities/DN    PT Home Exercise Plan  Access Code: [email or web address]   Consulted and Agree with Plan of Care  Patient       Patient will benefit from skilled therapeutic intervention in order to improve the following deficits and impairments:  Increased fascial restricitons, Increased muscle spasms, Pain, Impaired UE functional use, Postural dysfunction, Decreased strength  Visit Diagnosis: Acute pain of left shoulder - Plan: PT plan of care cert/re-cert  Abnormal posture - Plan: PT plan of care cert/re-cert  Muscle weakness (generalized) - Plan: PT plan of care cert/re-cert     Problem List Patient Active Problem List   Diagnosis Date Noted  . Acute pain of left shoulder 09/10/2019  . Recurrent sinus infections 10/08/2018  . Sinus pressure 10/08/2018  . Postsurgical hypothyroidism 09/28/2018   . Migraine without aura and without status migrainosus, not intractable 09/28/2018  . Allergic rhinitis 09/28/2018  . Tobacco use disorder   . Family history of colonic polyps 05/05/2018      SLaureen Abrahams PT, DPT 09/16/19 10:49 AM     [facility] [address]Phone: [phone]  Fax:  [phone] Name: Carolyn PeyserMRN: [id number] of Birth: [birth year]     PHYSICAL THERAPY DISCHARGE SUMMARY  Visits from Start of Care: 1  Current functional level related to goals / functional outcomes: See above   Remaining deficits: Unknown, see above   Education / Equipment: HEP  Plan: Patient agrees to discharge.  Patient goals were not met. Patient is being discharged due to the patient's request.  ?????     SLaureen Abrahams PT, DPT 09/20/19 12:47 PM  [facility] [place] [address] [phone](office) [phone](fax)

## 2019-09-22 ENCOUNTER — Encounter: Payer: BC Managed Care – PPO | Admitting: Physical Therapy

## 2019-10-01 ENCOUNTER — Encounter: Payer: BC Managed Care – PPO | Admitting: Physical Therapy

## 2019-10-06 ENCOUNTER — Encounter: Payer: BC Managed Care – PPO | Admitting: Physical Therapy

## 2019-10-07 ENCOUNTER — Ambulatory Visit (INDEPENDENT_AMBULATORY_CARE_PROVIDER_SITE_OTHER): Payer: BC Managed Care – PPO

## 2019-10-07 ENCOUNTER — Other Ambulatory Visit: Payer: Self-pay

## 2019-10-07 DIAGNOSIS — Z1231 Encounter for screening mammogram for malignant neoplasm of breast: Secondary | ICD-10-CM | POA: Diagnosis not present

## 2019-10-22 ENCOUNTER — Ambulatory Visit: Payer: BC Managed Care – PPO | Admitting: Sports Medicine

## 2019-12-29 ENCOUNTER — Other Ambulatory Visit: Payer: Self-pay | Admitting: Physician Assistant

## 2019-12-29 DIAGNOSIS — E890 Postprocedural hypothyroidism: Secondary | ICD-10-CM

## 2019-12-29 DIAGNOSIS — G43009 Migraine without aura, not intractable, without status migrainosus: Secondary | ICD-10-CM

## 2020-02-12 ENCOUNTER — Encounter: Payer: Self-pay | Admitting: Physician Assistant

## 2020-03-07 ENCOUNTER — Encounter: Payer: Self-pay | Admitting: Medical-Surgical

## 2020-03-25 ENCOUNTER — Other Ambulatory Visit: Payer: Self-pay | Admitting: Physician Assistant

## 2020-03-25 DIAGNOSIS — E890 Postprocedural hypothyroidism: Secondary | ICD-10-CM

## 2020-05-15 ENCOUNTER — Other Ambulatory Visit: Payer: Self-pay | Admitting: Physician Assistant

## 2020-05-15 DIAGNOSIS — E890 Postprocedural hypothyroidism: Secondary | ICD-10-CM

## 2020-05-15 NOTE — Telephone Encounter (Signed)
Needs to establish with new PCP

## 2020-05-16 NOTE — Telephone Encounter (Signed)
LVM to set up appt with new PCP for refills

## 2020-06-29 ENCOUNTER — Other Ambulatory Visit: Payer: Self-pay | Admitting: Physician Assistant

## 2020-06-29 DIAGNOSIS — E890 Postprocedural hypothyroidism: Secondary | ICD-10-CM

## 2020-10-04 ENCOUNTER — Other Ambulatory Visit: Payer: Self-pay | Admitting: Obstetrics & Gynecology

## 2020-10-04 ENCOUNTER — Other Ambulatory Visit: Payer: Self-pay | Admitting: Physician Assistant

## 2020-10-04 DIAGNOSIS — Z1231 Encounter for screening mammogram for malignant neoplasm of breast: Secondary | ICD-10-CM

## 2020-10-12 ENCOUNTER — Other Ambulatory Visit: Payer: Self-pay

## 2020-10-12 ENCOUNTER — Ambulatory Visit (INDEPENDENT_AMBULATORY_CARE_PROVIDER_SITE_OTHER): Payer: BC Managed Care – PPO

## 2020-10-12 DIAGNOSIS — Z1231 Encounter for screening mammogram for malignant neoplasm of breast: Secondary | ICD-10-CM | POA: Diagnosis not present

## 2021-11-26 ENCOUNTER — Other Ambulatory Visit: Payer: Self-pay | Admitting: Physician Assistant

## 2021-11-26 DIAGNOSIS — Z1231 Encounter for screening mammogram for malignant neoplasm of breast: Secondary | ICD-10-CM

## 2021-11-28 ENCOUNTER — Other Ambulatory Visit: Payer: Self-pay

## 2021-11-28 ENCOUNTER — Ambulatory Visit: Payer: BC Managed Care – PPO

## 2021-11-28 DIAGNOSIS — Z1231 Encounter for screening mammogram for malignant neoplasm of breast: Secondary | ICD-10-CM

## 2022-07-25 IMAGING — MG MM DIGITAL SCREENING BILAT W/ TOMO AND CAD
8 series · 8 of 24 positions shown · non-contrast
Comparison: Previous exam(s).

CLINICAL DATA: Screening.

EXAM:
DIGITAL SCREENING BILATERAL MAMMOGRAM WITH TOMOSYNTHESIS AND CAD
TECHNIQUE: Bilateral screening digital craniocaudal and mediolateral oblique
mammograms were obtained. Bilateral screening digital breast
tomosynthesis was performed. The images were evaluated with
computer-aided detection.

[R CC synth-2D]
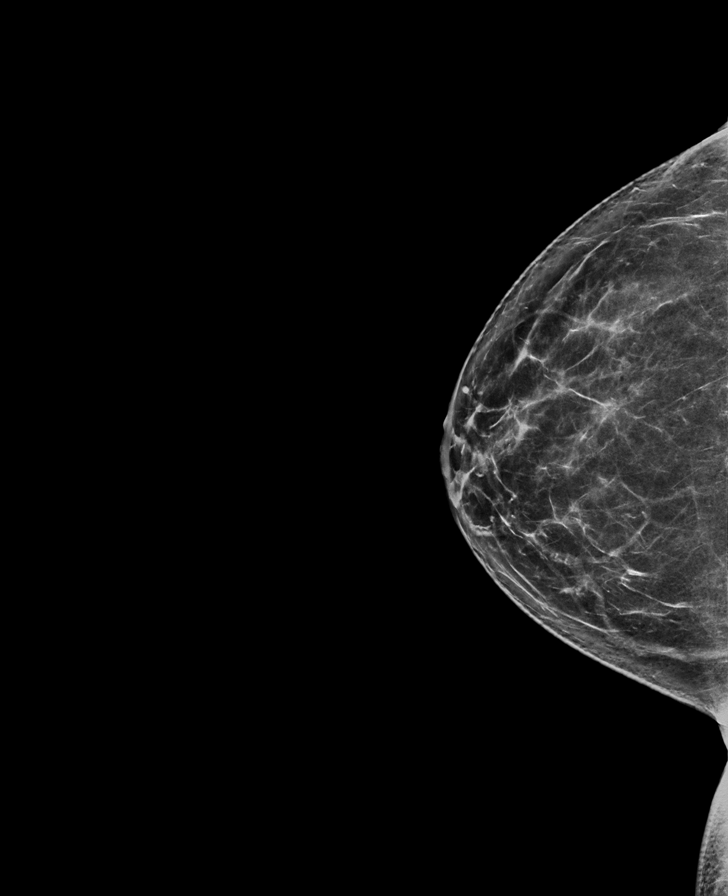

[R MLO synth-2D]
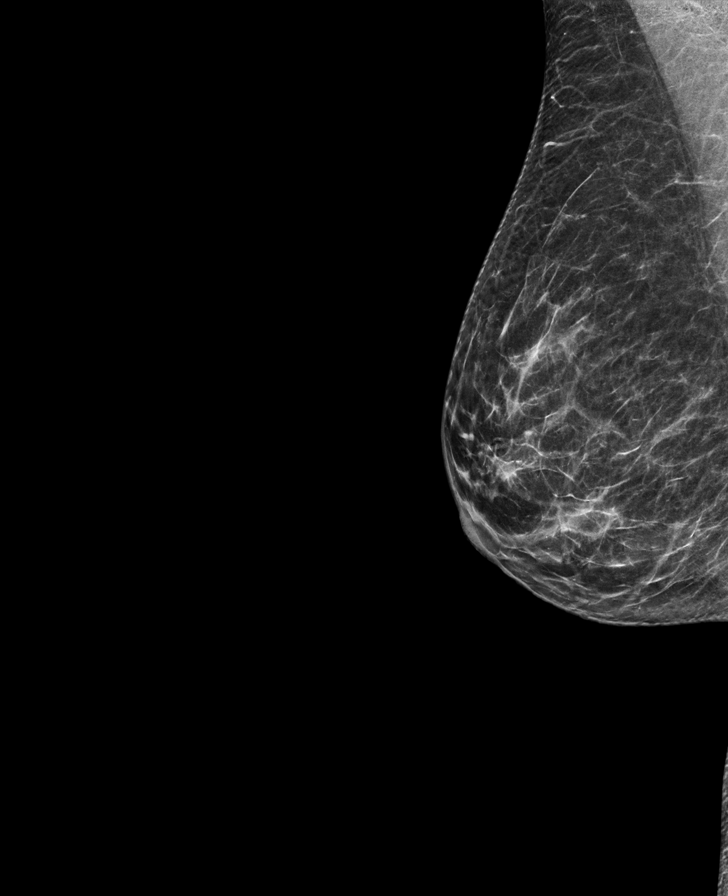

[L CC synth-2D]
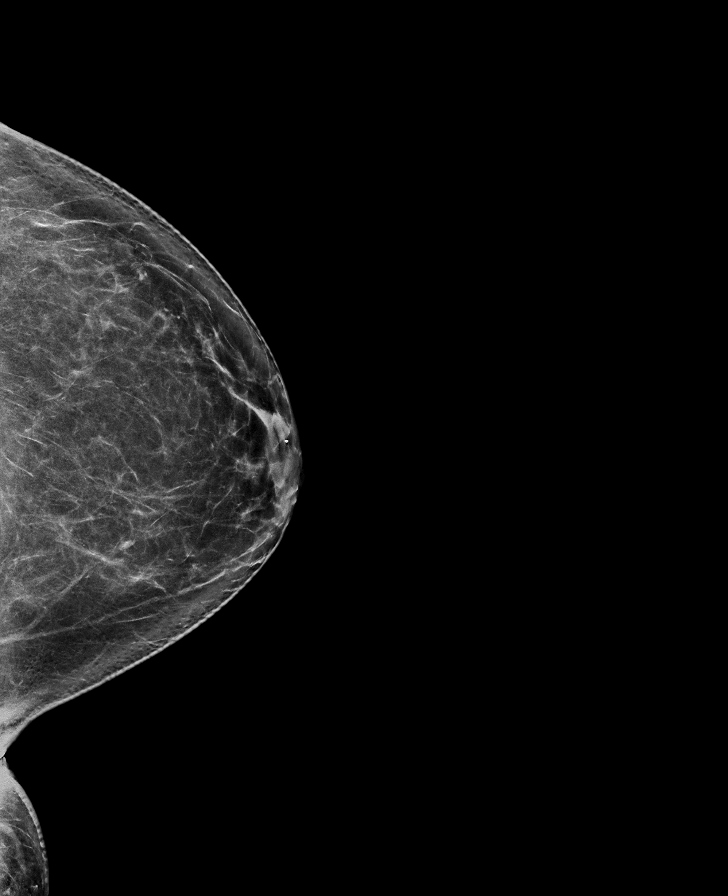

[L MLO synth-2D]
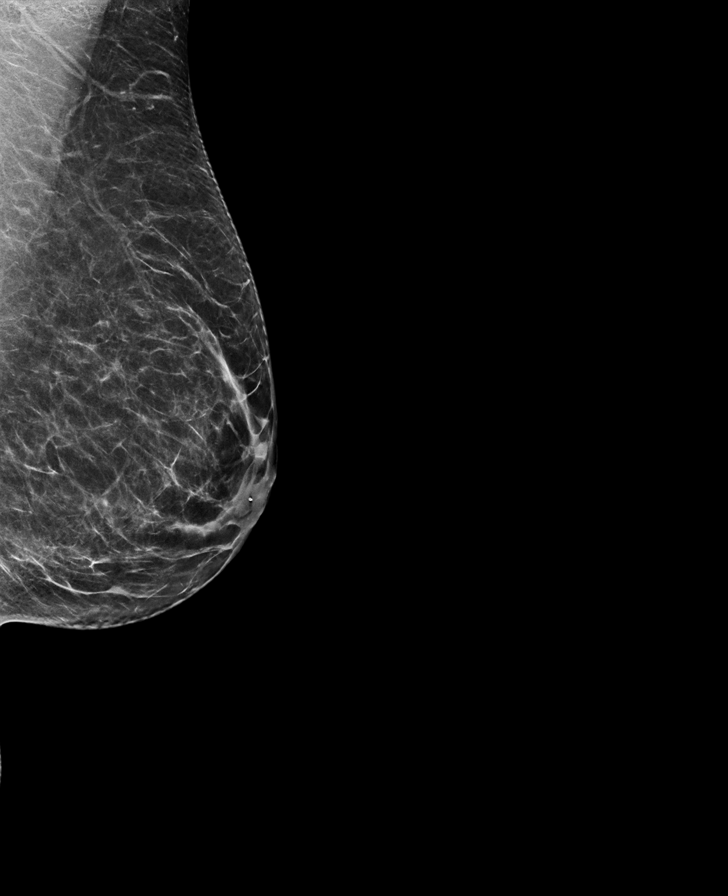

[R MLO tomo · tomo slice 33/64.0]
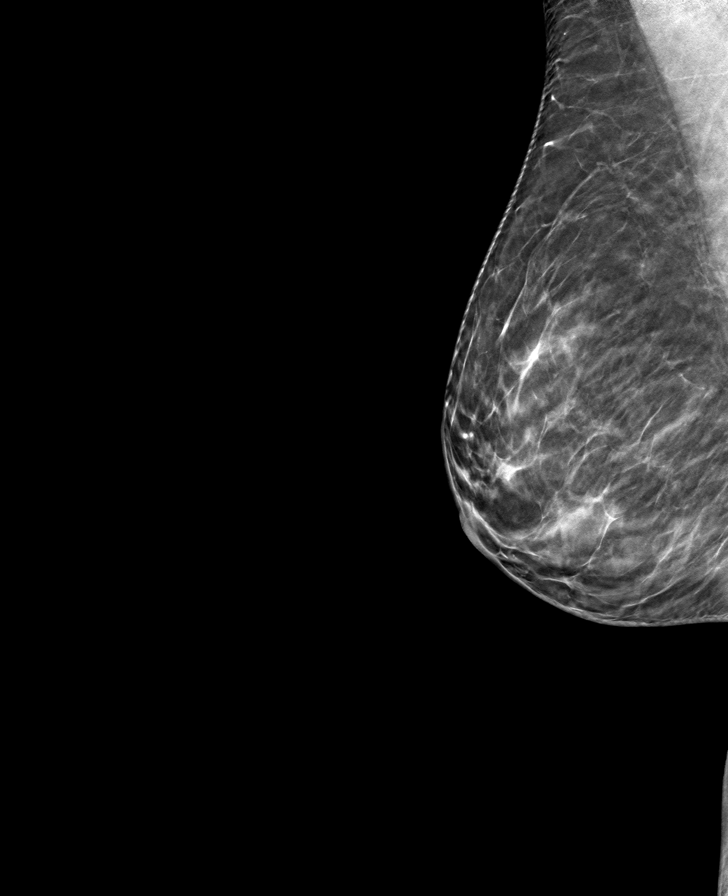

[L CC tomo · tomo slice 41/81.0]
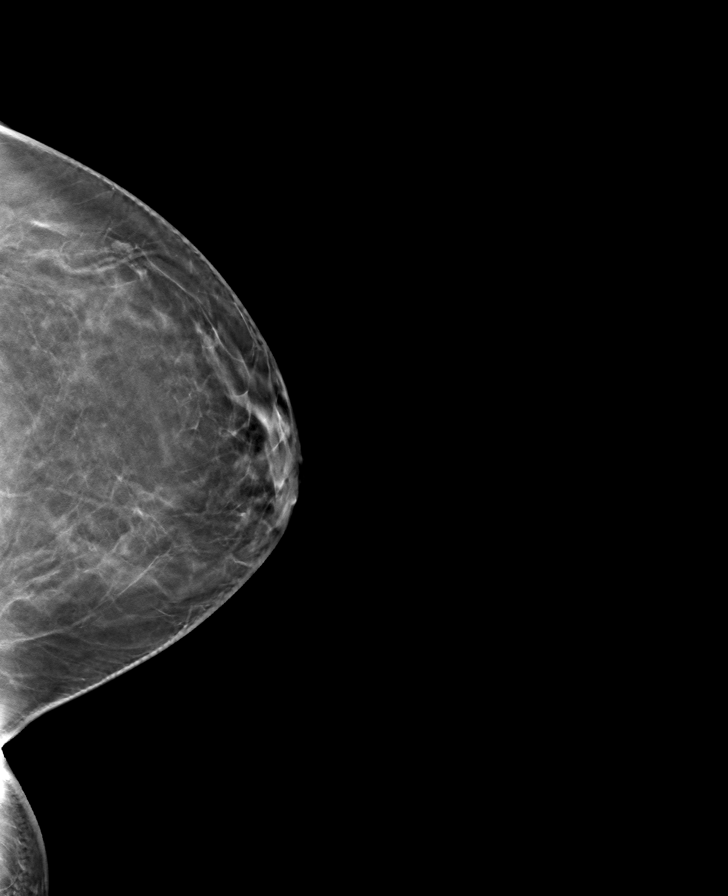

[L MLO tomo · tomo slice 36/71.0]
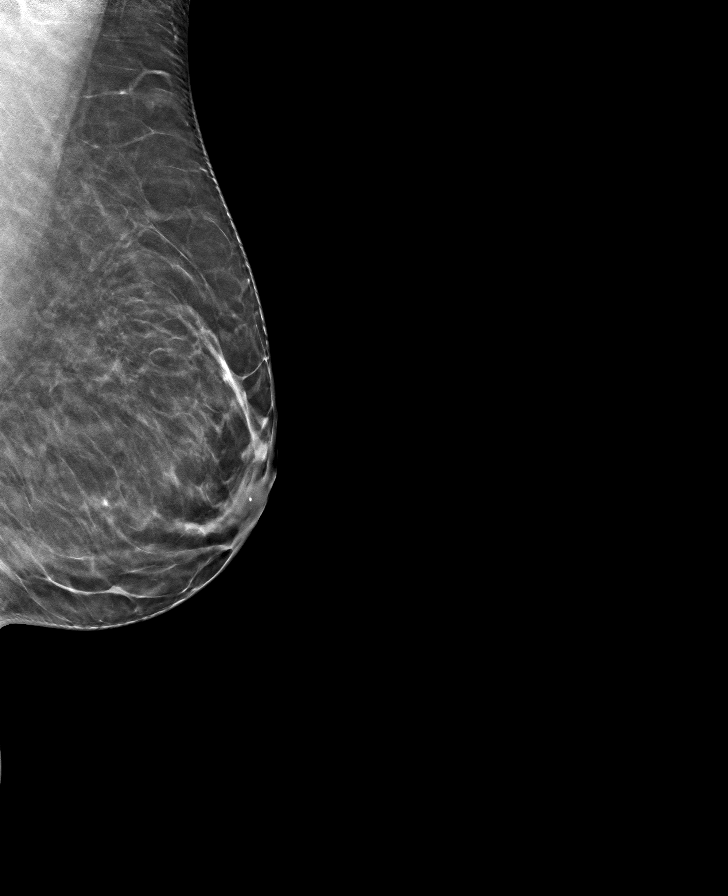

[R CC tomo · tomo slice 37/73.0]
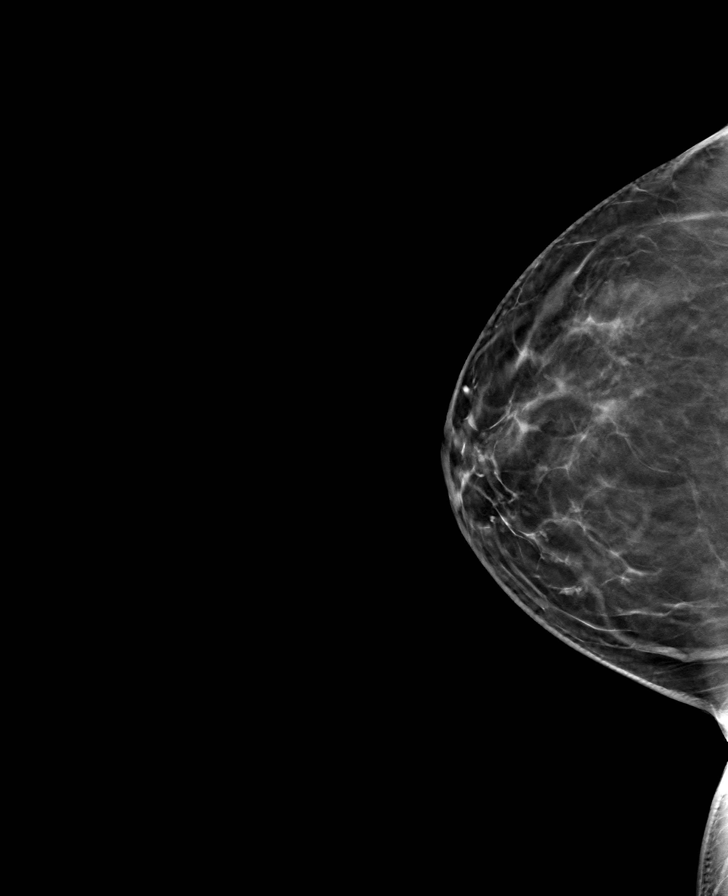

[8 of 24 positions shown; findings below may reference images not displayed]

ACR Breast Density Category b: There are scattered areas of
fibroglandular density.
FINDINGS: There are no findings suspicious for malignancy.
IMPRESSION: No mammographic evidence of malignancy. A result letter of this
screening mammogram will be mailed directly to the patient.

RECOMMENDATION:
Screening mammogram in one year. (Code:[id number]-O-LD2)

BI-RADS CATEGORY  1: Negative.

## 2023-01-20 ENCOUNTER — Other Ambulatory Visit: Payer: Self-pay | Admitting: Physician Assistant

## 2023-01-20 DIAGNOSIS — Z1231 Encounter for screening mammogram for malignant neoplasm of breast: Secondary | ICD-10-CM

## 2023-02-05 ENCOUNTER — Ambulatory Visit (INDEPENDENT_AMBULATORY_CARE_PROVIDER_SITE_OTHER): Payer: BC Managed Care – PPO

## 2023-02-05 DIAGNOSIS — Z1231 Encounter for screening mammogram for malignant neoplasm of breast: Secondary | ICD-10-CM | POA: Diagnosis not present
